# Patient Record
Sex: Female | Born: 1970 | Race: White | Hispanic: No | Marital: Married | Smoking: Never smoker
Health system: Southern US, Community
[De-identification: ages and names within clinical notes are randomized; demographics above are authoritative.]

## PROBLEM LIST (undated history)

## (undated) DIAGNOSIS — K219 Gastro-esophageal reflux disease without esophagitis: Secondary | ICD-10-CM

## (undated) HISTORY — PX: DILATION AND CURETTAGE OF UTERUS: SHX78

---

## 2018-10-19 ENCOUNTER — Other Ambulatory Visit: Payer: Self-pay | Admitting: Otolaryngology

## 2018-10-19 DIAGNOSIS — L723 Sebaceous cyst: Secondary | ICD-10-CM

## 2018-10-26 ENCOUNTER — Ambulatory Visit
Admission: RE | Admit: 2018-10-26 | Discharge: 2018-10-26 | Disposition: A | Discharge status: Home / Self Care | Payer: 59 | Source: Ambulatory Visit | Attending: Otolaryngology | Admitting: Otolaryngology

## 2018-10-26 ENCOUNTER — Other Ambulatory Visit: Payer: Self-pay

## 2018-10-26 ENCOUNTER — Encounter (INDEPENDENT_AMBULATORY_CARE_PROVIDER_SITE_OTHER): Payer: Self-pay

## 2018-10-26 DIAGNOSIS — L723 Sebaceous cyst: Secondary | ICD-10-CM | POA: Diagnosis not present

## 2018-10-26 MED ORDER — IOHEXOL 300 MG/ML  SOLN
75.0000 mL | Freq: Once | INTRAMUSCULAR | Status: AC | PRN
  Administered 2018-10-26: 10:00:00 75 mL via INTRAVENOUS

## 2018-12-04 ENCOUNTER — Other Ambulatory Visit: Payer: Self-pay

## 2018-12-04 ENCOUNTER — Other Ambulatory Visit
Admission: RE | Admit: 2018-12-04 | Discharge: 2018-12-04 | Disposition: A | Discharge status: Home / Self Care | Payer: 59 | Source: Ambulatory Visit | Attending: Otolaryngology | Admitting: Otolaryngology

## 2018-12-04 DIAGNOSIS — I82891 Chronic embolism and thrombosis of other specified veins: Secondary | ICD-10-CM | POA: Diagnosis not present

## 2018-12-04 DIAGNOSIS — Z20828 Contact with and (suspected) exposure to other viral communicable diseases: Secondary | ICD-10-CM | POA: Insufficient documentation

## 2018-12-04 DIAGNOSIS — L989 Disorder of the skin and subcutaneous tissue, unspecified: Secondary | ICD-10-CM | POA: Diagnosis present

## 2018-12-04 DIAGNOSIS — Z01812 Encounter for preprocedural laboratory examination: Secondary | ICD-10-CM | POA: Insufficient documentation

## 2018-12-04 DIAGNOSIS — Z79899 Other long term (current) drug therapy: Secondary | ICD-10-CM | POA: Diagnosis not present

## 2018-12-04 DIAGNOSIS — K219 Gastro-esophageal reflux disease without esophagitis: Secondary | ICD-10-CM | POA: Diagnosis not present

## 2018-12-04 LAB — SARS CORONAVIRUS 2 (TAT 6-24 HRS): SARS Coronavirus 2: NEGATIVE

## 2018-12-07 ENCOUNTER — Encounter: Payer: Self-pay | Admitting: Otolaryngology

## 2018-12-07 ENCOUNTER — Ambulatory Visit: Payer: 59 | Admitting: Anesthesiology

## 2018-12-07 ENCOUNTER — Ambulatory Visit
Admission: RE | Admit: 2018-12-07 | Discharge: 2018-12-07 | Disposition: A | Discharge status: Home / Self Care | Payer: 59 | Attending: Otolaryngology | Admitting: Otolaryngology

## 2018-12-07 ENCOUNTER — Encounter
Admission: RE | Disposition: A | Discharge status: Home / Self Care | Payer: Self-pay | Source: Home / Self Care | Attending: Otolaryngology

## 2018-12-07 DIAGNOSIS — Z79899 Other long term (current) drug therapy: Secondary | ICD-10-CM | POA: Diagnosis not present

## 2018-12-07 DIAGNOSIS — K219 Gastro-esophageal reflux disease without esophagitis: Secondary | ICD-10-CM | POA: Insufficient documentation

## 2018-12-07 DIAGNOSIS — I82891 Chronic embolism and thrombosis of other specified veins: Secondary | ICD-10-CM | POA: Insufficient documentation

## 2018-12-07 DIAGNOSIS — L989 Disorder of the skin and subcutaneous tissue, unspecified: Secondary | ICD-10-CM | POA: Diagnosis present

## 2018-12-07 HISTORY — DX: Gastro-esophageal reflux disease without esophagitis: K21.9

## 2018-12-07 HISTORY — PX: MASS EXCISION: SHX2000

## 2018-12-07 LAB — POCT PREGNANCY, URINE: Preg Test, Ur: NEGATIVE

## 2018-12-07 SURGERY — EXCISION MASS
Anesthesia: General | Site: Face | Laterality: Right

## 2018-12-07 MED ORDER — SCOPOLAMINE 1 MG/3DAYS TD PT72
1.0000 | MEDICATED_PATCH | Freq: Once | TRANSDERMAL | Status: DC
  Administered 2018-12-07: 10:00:00 1.5 mg via TRANSDERMAL

## 2018-12-07 MED ORDER — FENTANYL CITRATE (PF) 100 MCG/2ML IJ SOLN
INTRAMUSCULAR | Status: DC | PRN
  Administered 2018-12-07 (×2): 50 ug via INTRAVENOUS

## 2018-12-07 MED ORDER — ACETAMINOPHEN 160 MG/5ML PO SOLN: 325.0000 mg | ORAL | Status: DC | PRN

## 2018-12-07 MED ORDER — DEXMEDETOMIDINE HCL 200 MCG/2ML IV SOLN
INTRAVENOUS | Status: DC | PRN
  Administered 2018-12-07: 5 ug via INTRAVENOUS

## 2018-12-07 MED ORDER — MIDAZOLAM HCL 5 MG/5ML IJ SOLN
INTRAMUSCULAR | Status: DC | PRN
  Administered 2018-12-07: 2 mg via INTRAVENOUS

## 2018-12-07 MED ORDER — BACITRACIN-NEOMYCIN-POLYMYXIN OINTMENT TUBE
TOPICAL_OINTMENT | CUTANEOUS | Status: DC | PRN
  Administered 2018-12-07: 1 via TOPICAL

## 2018-12-07 MED ORDER — ACETAMINOPHEN 325 MG PO TABS: 325.0000 mg | ORAL_TABLET | ORAL | Status: DC | PRN

## 2018-12-07 MED ORDER — HYDROCODONE-ACETAMINOPHEN 5-325 MG PO TABS: 1.0000 | ORAL_TABLET | Freq: Four times a day (QID) | ORAL | 0 refills | Status: AC | PRN

## 2018-12-07 MED ORDER — ONDANSETRON HCL 4 MG/2ML IJ SOLN
INTRAMUSCULAR | Status: DC | PRN
  Administered 2018-12-07: 4 mg via INTRAVENOUS

## 2018-12-07 MED ORDER — LACTATED RINGERS IV SOLN
INTRAVENOUS | Status: DC
  Administered 2018-12-07: 10:00:00 via INTRAVENOUS

## 2018-12-07 MED ORDER — LIDOCAINE HCL (CARDIAC) PF 100 MG/5ML IV SOSY
PREFILLED_SYRINGE | INTRAVENOUS | Status: DC | PRN
  Administered 2018-12-07: 30 mg via INTRATRACHEAL

## 2018-12-07 MED ORDER — OXYCODONE HCL 5 MG PO TABS: 5.0000 mg | ORAL_TABLET | Freq: Once | ORAL | Status: DC | PRN

## 2018-12-07 MED ORDER — PROPOFOL 10 MG/ML IV BOLUS
INTRAVENOUS | Status: DC | PRN
  Administered 2018-12-07: 150 mg via INTRAVENOUS

## 2018-12-07 MED ORDER — ACETAMINOPHEN 10 MG/ML IV SOLN
INTRAVENOUS | Status: DC | PRN
  Administered 2018-12-07: 1000 mg via INTRAVENOUS

## 2018-12-07 MED ORDER — LIDOCAINE-EPINEPHRINE 1 %-1:100000 IJ SOLN
INTRAMUSCULAR | Status: DC | PRN
  Administered 2018-12-07: 3 mL

## 2018-12-07 MED ORDER — OXYCODONE HCL 5 MG/5ML PO SOLN: 5.0000 mg | Freq: Once | ORAL | Status: DC | PRN

## 2018-12-07 MED ORDER — FENTANYL CITRATE (PF) 100 MCG/2ML IJ SOLN: 25.0000 ug | INTRAMUSCULAR | Status: DC | PRN

## 2018-12-07 MED ORDER — DEXAMETHASONE SODIUM PHOSPHATE 4 MG/ML IJ SOLN
INTRAMUSCULAR | Status: DC | PRN
  Administered 2018-12-07: 4 mg via INTRAVENOUS

## 2018-12-07 MED ORDER — EPHEDRINE SULFATE 50 MG/ML IJ SOLN
INTRAMUSCULAR | Status: DC | PRN
  Administered 2018-12-07: 10 mg via INTRAVENOUS
  Administered 2018-12-07: 5 mg via INTRAVENOUS
  Administered 2018-12-07: 10 mg via INTRAVENOUS
  Administered 2018-12-07: 5 mg via INTRAVENOUS

## 2018-12-07 MED ORDER — GLYCOPYRROLATE 0.2 MG/ML IJ SOLN
INTRAMUSCULAR | Status: DC | PRN
  Administered 2018-12-07: 0.1 mg via INTRAVENOUS

## 2018-12-07 MED ORDER — ONDANSETRON HCL 4 MG/2ML IJ SOLN: 4.0000 mg | Freq: Once | INTRAMUSCULAR | Status: DC | PRN

## 2018-12-07 SURGICAL SUPPLY — 24 items
APPLICATOR COTTON TIP 6 STRL (MISCELLANEOUS) ×4 IMPLANT
APPLICATOR COTTON TIP 6IN STRL (MISCELLANEOUS) ×12
CORD BIP STRL DISP 12FT (MISCELLANEOUS) ×3 IMPLANT
DRSG TEGADERM 2-3/8X2-3/4 SM (GAUZE/BANDAGES/DRESSINGS) ×3 IMPLANT
DRSG TELFA 4X3 1S NADH ST (GAUZE/BANDAGES/DRESSINGS) ×3 IMPLANT
ELECT REM PT RETURN 9FT ADLT (ELECTROSURGICAL) ×3
ELECTRODE REM PT RTRN 9FT ADLT (ELECTROSURGICAL) ×1 IMPLANT
GAUZE 4X4 16PLY RFD (DISPOSABLE) ×3 IMPLANT
GLOVE PI ULTRA LF STRL 7.5 (GLOVE) ×2 IMPLANT
GLOVE PI ULTRA NON LATEX 7.5 (GLOVE) ×4
GOWN STRL REUS W/ TWL LRG LVL3 (GOWN DISPOSABLE) ×1 IMPLANT
GOWN STRL REUS W/TWL LRG LVL3 (GOWN DISPOSABLE) ×2
KIT TURNOVER KIT A (KITS) ×3 IMPLANT
NEEDLE HYPO 25GX1X1/2 BEV (NEEDLE) ×3 IMPLANT
NS IRRIG 500ML POUR BTL (IV SOLUTION) ×3 IMPLANT
PACK ENT CUSTOM (PACKS) ×3 IMPLANT
SOL PREP PVP 2OZ (MISCELLANEOUS) ×3
SOLUTION PREP PVP 2OZ (MISCELLANEOUS) ×1 IMPLANT
STRAP BODY AND KNEE 60X3 (MISCELLANEOUS) ×3 IMPLANT
SUCTION FRAZIER TIP 10 FR DISP (SUCTIONS) ×3 IMPLANT
SUT PROLENE 6 0 P 1 18 (SUTURE) ×3 IMPLANT
SUT VIC AB 5-0 P-3 18X BRD (SUTURE) ×1 IMPLANT
SUT VIC AB 5-0 P3 18 (SUTURE) ×2
SYR 10ML LL (SYRINGE) ×3 IMPLANT

## 2018-12-07 NOTE — Discharge Instructions (Signed)
Scopolamine skin patches REMOVE PATCH IN 72 HOURS AND WASH HANDS IMMEDIATELY What is this medicine? SCOPOLAMINE (skoe POL a meen) is used to prevent nausea and vomiting caused by motion sickness, anesthesia and surgery. This medicine may be used for other purposes; ask your health care provider or pharmacist if you have questions. COMMON BRAND NAME(S): Transderm Scop What should I tell my health care provider before I take this medicine? They need to know if you have any of these conditions:  are scheduled to have a gastric secretion test  glaucoma  heart disease  kidney disease  liver disease  lung or breathing disease, like asthma  mental illness  prostate disease  seizures  stomach or intestine problems  trouble passing urine  an unusual or allergic reaction to scopolamine, atropine, other medicines, foods, dyes, or preservatives  pregnant or trying to get pregnant  breast-feeding How should I use this medicine? This medicine is for external use only. Follow the directions on the prescription label. Wear only 1 patch at a time. Choose an area behind the ear, that is clean, dry, hairless and free from any cuts or irritation. Wipe the area with a clean dry tissue. Peel off the plastic backing of the skin patch, trying not to touch the adhesive side with your hands. Do not cut the patches. Firmly apply to the area you have chosen, with the metallic side of the patch to the skin and the tan-colored side showing. Once firmly in place, wash your hands well with soap and water. Do not get this medicine into your eyes. After removing the patch, wash your hands and the area behind your ear thoroughly with soap and water. The patch will still contain some medicine after use. To avoid accidental contact or ingestion by children or pets, fold the used patch in half with the sticky side together and throw away in the trash out of the reach of children and pets. If you need to use a second  patch after you remove the first, place it behind the other ear. A special MedGuide will be given to you by the pharmacist with each prescription and refill. Be sure to read this information carefully each time. Talk to your pediatrician regarding the use of this medicine in children. Special care may be needed. Overdosage: If you think you have taken too much of this medicine contact a poison control center or emergency room at once. NOTE: This medicine is only for you. Do not share this medicine with others. What if I miss a dose? This does not apply. This medicine is not for regular use. What may interact with this medicine?  alcohol  antihistamines for allergy cough and cold  atropine  certain medicines for anxiety or sleep  certain medicines for bladder problems like oxybutynin, tolterodine  certain medicines for depression like amitriptyline, fluoxetine, sertraline  certain medicines for stomach problems like dicyclomine, hyoscyamine  certain medicines for Parkinson's disease like benztropine, trihexyphenidyl  certain medicines for seizures like phenobarbital, primidone  general anesthetics like halothane, isoflurane, methoxyflurane, propofol  ipratropium  local anesthetics like lidocaine, pramoxine, tetracaine  medicines that relax muscles for surgery  phenothiazines like chlorpromazine, mesoridazine, prochlorperazine, thioridazine  narcotic medicines for pain  other belladonna alkaloids This list may not describe all possible interactions. Give your health care provider a list of all the medicines, herbs, non-prescription drugs, or dietary supplements you use. Also tell them if you smoke, drink alcohol, or use illegal drugs. Some items may interact with  your medicine. What should I watch for while using this medicine? Limit contact with water while swimming and bathing because the patch may fall off. If the patch falls off, throw it away and put a new one behind the  other ear. You may get drowsy or dizzy. Do not drive, use machinery, or do anything that needs mental alertness until you know how this medicine affects you. Do not stand or sit up quickly, especially if you are an older patient. This reduces the risk of dizzy or fainting spells. Alcohol may interfere with the effect of this medicine. Avoid alcoholic drinks. Your mouth may get dry. Chewing sugarless gum or sucking hard candy, and drinking plenty of water may help. Contact your healthcare professional if the problem does not go away or is severe. This medicine may cause dry eyes and blurred vision. If you wear contact lenses, you may feel some discomfort. Lubricating drops may help. See your healthcare professional if the problem does not go away or is severe. If you are going to need surgery, an MRI, CT scan, or other procedure, tell your healthcare professional that you are using this medicine. You may need to remove the patch before the procedure. What side effects may I notice from receiving this medicine? Side effects that you should report to your doctor or health care professional as soon as possible:  allergic reactions like skin rash, itching or hives; swelling of the face, lips, or tongue  blurred vision  changes in vision  confusion  dizziness  eye pain  fast, irregular heartbeat  hallucinations, loss of contact with reality  nausea, vomiting  pain or trouble passing urine  restlessness  seizures  skin irritation  stomach pain Side effects that usually do not require medical attention (report to your doctor or health care professional if they continue or are bothersome):  drowsiness  dry mouth  headache  sore throat This list may not describe all possible side effects. Call your doctor for medical advice about side effects. You may report side effects to FDA at 1-800-FDA-1088. Where should I keep my medicine? Keep out of the reach of children. Store at room  temperature between 20 and 25 degrees C (68 and 77 degrees F). Keep this medicine in the foil package until ready to use. Throw away any unused medicine after the expiration date. NOTE: This sheet is a summary. It may not cover all possible information. If you have questions about this medicine, talk to your doctor, pharmacist, or health care provider.  2020 Elsevier/Gold Standard (2017-05-20 16:14:46)   General Anesthesia, Adult, Care After This sheet gives you information about how to care for yourself after your procedure. Your health care provider may also give you more specific instructions. If you have problems or questions, contact your health care provider. What can I expect after the procedure? After the procedure, the following side effects are common:  Pain or discomfort at the IV site.  Nausea.  Vomiting.  Sore throat.  Trouble concentrating.  Feeling cold or chills.  Weak or tired.  Sleepiness and fatigue.  Soreness and body aches. These side effects can affect parts of the body that were not involved in surgery. Follow these instructions at home:  For at least 24 hours after the procedure:  Have a responsible adult stay with you. It is important to have someone help care for you until you are awake and alert.  Rest as needed.  Do not: ? Participate in activities in which  you could fall or become injured. ? Drive. ? Use heavy machinery. ? Drink alcohol. ? Take sleeping pills or medicines that cause drowsiness. ? Make important decisions or sign legal documents. ? Take care of children on your own. Eating and drinking  Follow any instructions from your health care provider about eating or drinking restrictions.  When you feel hungry, start by eating small amounts of foods that are soft and easy to digest (bland), such as toast. Gradually return to your regular diet.  Drink enough fluid to keep your urine pale yellow.  If you vomit, rehydrate by drinking  water, juice, or clear broth. General instructions  If you have sleep apnea, surgery and certain medicines can increase your risk for breathing problems. Follow instructions from your health care provider about wearing your sleep device: ? Anytime you are sleeping, including during daytime naps. ? While taking prescription pain medicines, sleeping medicines, or medicines that make you drowsy.  Return to your normal activities as told by your health care provider. Ask your health care provider what activities are safe for you.  Take over-the-counter and prescription medicines only as told by your health care provider.  If you smoke, do not smoke without supervision.  Keep all follow-up visits as told by your health care provider. This is important. Contact a health care provider if:  You have nausea or vomiting that does not get better with medicine.  You cannot eat or drink without vomiting.  You have pain that does not get better with medicine.  You are unable to pass urine.  You develop a skin rash.  You have a fever.  You have redness around your IV site that gets worse. Get help right away if:  You have difficulty breathing.  You have chest pain.  You have blood in your urine or stool, or you vomit blood. Summary  After the procedure, it is common to have a sore throat or nausea. It is also common to feel tired.  Have a responsible adult stay with you for the first 24 hours after general anesthesia. It is important to have someone help care for you until you are awake and alert.  When you feel hungry, start by eating small amounts of foods that are soft and easy to digest (bland), such as toast. Gradually return to your regular diet.  Drink enough fluid to keep your urine pale yellow.  Return to your normal activities as told by your health care provider. Ask your health care provider what activities are safe for you. This information is not intended to replace  advice given to you by your health care provider. Make sure you discuss any questions you have with your health care provider. Document Released: 06/07/2000 Document Revised: 03/04/2017 Document Reviewed: 10/15/2016 Elsevier Patient Education  2020 [employer].

## 2018-12-07 NOTE — Op Note (Signed)
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  It was purple and appeared to be dilated veins that were heaped together and a roundish mass approximately 2 cm x 1-1/2 cm x 1-1/2 cm.  Procedure: The patient was brought to the operating room and placed in supine position.  She was given general anesthesia by oral endotracheal intubation.  Her head was turned slightly to the left and the right side of her face was prepped.  Continuous facial nerve monitoring was done of the periorbital muscle.  A second facial nerve probe was placed to be used as needed.  A skin incision was marked beneath the infraorbital rim and a horizontal slightly curved incision line following skin creases and overlying the mass.  Approximately 3 mL of 1% Xylocaine with epi 1-100,000 were used for infiltration into the soft tissues around the mass.  When she was prepped and draped the incision in the right cheek skin was performed.  This was carried through the subcu and bipolar cautery was used to control small amounts of bleeding in the subcu tissue.  The fat was loosely dissected through until the periorbital muscle was found.  Dissection was carried along this to free the fat off of it and immediately could see a purpleish mass that was sticking up and was quite soft and compressible.  Dissection was carefully made all the way around this to loosen it up from the overlying fat.  It was scarred to the periorbital muscle on its inferior border of the muscle.  Bipolar cautery was used to free up the fascial bands that were tethered between the tumor and the muscle and fat.  More medially appeared to  be a vein that was running into this and bipolar cautery was used to cut across this vein and control bleeding.  The medial structures were freed up from fat.  The deeper edge of the tumor was freed from some fat was overlying the periosteum of the infraorbital bone.  The infraorbital nerve was not encountered as there was still little fat around that and the tumor did not extend down to periosteum.  The deep inferior edge of the tumor seem to have another low venous attachment that was cut across with bipolar cautery.  This freed up the remaining attachment of the tumor and it was sent for permanent section.  The wound was irrigated copiously.  I could not see any further signs of tumor nor could I feel any abnormal masses in the wound.  Some deep stitches were used to help hold some fatty tissue together in the deep portion of the wound and then to pull together the fatty tissue in the subdermal plane.  The dermis was then closed with interrupted 6-0 Vicryls to pull the skin edges together and take the tension off the wound.  The skin edges were then held in apposition with a running 6-0 Prolene in a locking fashion.  The wound was then washed and covered with a small amount of Neosporin followed by Telfa and Tegaderm.  The patient tolerated the procedure well.  She was awakened taken to recovery  room in a satisfactory condition.  There were no operative complications.  Dispo: To PACU to be discharged home  Plan: She will rest at home with head elevated.  I have given her some Norco 5/325 to use for pain if necessary.  She does not need an antibiotic.  We will see her in 5 days for suture removal and wound assessment and then go over path report with her as well.  She will let me know if she has any problems that arise at home.  Kristen Banks  12/07/2018 11:47 AM

## 2018-12-07 NOTE — Transfer of Care (Signed)
Immediate Anesthesia Transfer of Care Note  Patient: Kristen Banks  Procedure(s) Performed: EXCISION RIGHT CHEEK TUMOR (Right Face)  Patient Location: PACU  Anesthesia Type: General LMA  Level of Consciousness: awake, alert  and patient cooperative  Airway and Oxygen Therapy: Patient Spontanous Breathing and Patient connected to supplemental oxygen  Post-op Assessment: Post-op Vital signs reviewed, Patient's Cardiovascular Status Stable, Respiratory Function Stable, Patent Airway and No signs of Nausea or vomiting  Post-op Vital Signs: Reviewed and stable  Complications: No apparent anesthesia complications

## 2018-12-07 NOTE — H&P (Signed)
H&P has been reviewed and patient reevaluated, no changes necessary. To be downloaded later.  

## 2018-12-07 NOTE — Anesthesia Procedure Notes (Signed)
Procedure Name: LMA Insertion Date/Time: 12/07/2018 10:32 AM Performed by: Georga Bora, CRNA Pre-anesthesia Checklist: Patient identified, Emergency Drugs available, Suction available, Timeout performed and Patient being monitored Patient Re-evaluated:Patient Re-evaluated prior to induction Oxygen Delivery Method: Circle system utilized Preoxygenation: Pre-oxygenation with 100% oxygen Induction Type: IV induction LMA: LMA inserted LMA Size: 4.0 Number of attempts: 1 Placement Confirmation: positive ETCO2 and breath sounds checked- equal and bilateral Tube secured with: Tape

## 2018-12-07 NOTE — Anesthesia Postprocedure Evaluation (Signed)
Anesthesia Post Note  Patient: Kristen Banks  Procedure(s) Performed: EXCISION RIGHT CHEEK TUMOR (Right Face)  Patient location during evaluation: PACU Anesthesia Type: General Level of consciousness: awake and alert and oriented Pain management: satisfactory to patient Vital Signs Assessment: post-procedure vital signs reviewed and stable Respiratory status: spontaneous breathing, nonlabored ventilation and respiratory function stable Cardiovascular status: blood pressure returned to baseline and stable Postop Assessment: Adequate PO intake and No signs of nausea or vomiting Anesthetic complications: no    Raliegh Ip

## 2018-12-07 NOTE — Anesthesia Preprocedure Evaluation (Signed)
Anesthesia Evaluation  Patient identified by MRN, date of birth, ID band Patient awake    Reviewed: Allergy & Precautions, H&P , NPO status , Patient's Chart, lab work & pertinent test results  Airway Mallampati: II  TM Distance: >3 FB Neck ROM: full    Dental no notable dental hx.    Pulmonary    Pulmonary exam normal breath sounds clear to auscultation       Cardiovascular Normal cardiovascular exam Rhythm:regular Rate:Normal     Neuro/Psych    GI/Hepatic GERD  ,  Endo/Other    Renal/GU      Musculoskeletal   Abdominal   Peds  Hematology   Anesthesia Other Findings   Reproductive/Obstetrics                             Anesthesia Physical Anesthesia Plan  ASA: II  Anesthesia Plan: General LMA   Post-op Pain Management:    Induction:   PONV Risk Score and Plan: 3 and Ondansetron, Dexamethasone, Scopolamine patch - Pre-op, Midazolam and Treatment may vary due to age or medical condition  Airway Management Planned:   Additional Equipment:   Intra-op Plan:   Post-operative Plan:   Informed Consent: I have reviewed the patients History and Physical, chart, labs and discussed the procedure including the risks, benefits and alternatives for the proposed anesthesia with the patient or authorized representative who has indicated his/her understanding and acceptance.       Plan Discussed with: CRNA  Anesthesia Plan Comments:         Anesthesia Quick Evaluation

## 2018-12-12 LAB — SURGICAL PATHOLOGY

## 2020-08-24 ENCOUNTER — Ambulatory Visit
Admission: EM | Admit: 2020-08-24 | Discharge: 2020-08-24 | Disposition: A | Discharge status: Home / Self Care | Payer: 59

## 2020-08-24 ENCOUNTER — Encounter: Payer: Self-pay | Admitting: Emergency Medicine

## 2020-08-24 ENCOUNTER — Other Ambulatory Visit: Payer: Self-pay

## 2020-08-24 DIAGNOSIS — T63481A Toxic effect of venom of other arthropod, accidental (unintentional), initial encounter: Secondary | ICD-10-CM

## 2020-08-24 MED ORDER — PREDNISONE 20 MG PO TABS: ORAL_TABLET | ORAL | 0 refills | Status: AC

## 2020-08-24 NOTE — ED Provider Notes (Signed)
[facility]    CSN: [id number] Arrival date & time: 08/24/20  1426      History   Chief Complaint Chief Complaint  Patient presents with   Insect Bite    Bee sting    HPI KESTREL MIS is a 50 y.o. female.   Kristen Banks presents with complaints of left hand and wrist pain and swelling s/p wasp sting/ bite yesterday. The insect was visualized at time of sting. Known bee allergy. Took claritin immediately afterwards which did seem to help some. She also used her Epi-Pen in hopes that this would help her swelling. Woke this morning around 0130 with itching to chest and anterior neck, took additional claritin and that helped. Left hand still swollen with pain radiating up arm to shoulder. Denies any wheezing, oral swelling, shortness of breath, tongue swellign or difficulty swallowing.  ROS per HPI, negative if not otherwise mentioned.     Past Medical History:  Diagnosis Date   GERD (gastroesophageal reflux disease)     There are no problems to display for this patient.   Past Surgical History:  Procedure Laterality Date   DILATION AND CURETTAGE OF UTERUS     x 3   MASS EXCISION Right 12/07/2018   Procedure: EXCISION RIGHT CHEEK TUMOR;  Surgeon: Vernie Murders, MD;  Location: [facility];  Service: ENT;  Laterality: Right;  NEED PLASTIC TRAY AND FACIAL NERVE MONITOR    OB History   No obstetric history on file.      Home Medications    Prior to Admission medications   Medication Sig Start Date End Date Taking? Authorizing Provider  Ascorbic Acid (VITAMIN C) 1000 MG tablet Take 1,000 mg by mouth daily.   Yes [provider]  b complex vitamins tablet Take 1 tablet by mouth daily.   Yes [provider]  Calcium Carbonate-Vit D-Min (CALCIUM 1200 PO) Take by mouth.   Yes [provider]  loratadine (CLARITIN) 10 MG tablet Take by mouth.   Yes [provider]  Multiple Vitamin (MULTIVITAMIN) tablet Take  1 tablet by mouth daily.   Yes [provider]  omeprazole (PRILOSEC) 20 MG capsule Take 20 mg by mouth daily.   Yes [provider]  predniSONE (DELTASONE) 20 MG tablet 3-3-3-2-2-2-1-1-1 08/24/20  Yes Emmett Arntz, Barron Alvine, NP  Probiotic Product (PROBIOTIC-10) CHEW Chew by mouth.   Yes [provider]  acetaminophen (TYLENOL) 500 MG tablet Take 500 mg by mouth every 6 (six) hours as needed.    [provider]  minocycline (DYNACIN) 100 MG tablet Take 200 mg by mouth daily.    [provider]    Family History History reviewed. No pertinent family history.  Social History Social History   Tobacco Use   Smoking status: Never   Smokeless tobacco: Never  Vaping Use   Vaping Use: Never used  Substance Use Topics   Alcohol use: Yes    Comment: occasionally     Allergies   Bee pollen   Review of Systems Review of Systems   Physical Exam Triage Vital Signs ED Triage Vitals  Enc Vitals Group     BP 08/24/20 1441 128/78     Pulse Rate 08/24/20 1441 67     Resp 08/24/20 1441 14     Temp 08/24/20 1441 98.4 F (36.9 C)     Temp Source 08/24/20 1441 Oral     SpO2 08/24/20 1441 97 %     Weight 08/24/20  1438 194 lb (88 kg)     Height 08/24/20 1438 5' 5.5" (1.664 m)     Head Circumference --      Peak Flow --      Pain Score 08/24/20 1438 4     Pain Loc --      Pain Edu? --      Excl. in GC? --    No data found.  Updated Vital Signs BP 128/78 (BP Location: Left Arm)   Pulse 67   Temp 98.4 F (36.9 C) (Oral)   Resp 14   Ht 5' 5.5" (1.664 m)   Wt 194 lb (88 kg)   LMP 06/24/2020 (Approximate)   SpO2 97%   BMI 31.79 kg/m   Visual Acuity Right Eye Distance:   Left Eye Distance:   Bilateral Distance:    Right Eye Near:   Left Eye Near:    Bilateral Near:     Physical Exam Constitutional:      General: She is not in acute distress.    Appearance: She is well-developed.  HENT:     Mouth/Throat:     Mouth: Mucous  membranes are moist.     Pharynx: Oropharynx is clear.  Cardiovascular:     Rate and Rhythm: Normal rate.  Pulmonary:     Effort: Pulmonary effort is normal.     Breath sounds: No stridor. No wheezing.  Musculoskeletal:     Comments: Left hand and wrist swelling and redness, extending around bite from lateral aspect of wrist   Skin:    General: Skin is warm and dry.  Neurological:     Mental Status: She is alert and oriented to person, place, and time.     UC Treatments / Results  Labs (all labs ordered are listed, but only abnormal results are displayed) Labs Reviewed - No data to display  EKG   Radiology No results found.  Procedures Procedures (including critical care time)  Medications Ordered in UC Medications - No data to display  Initial Impression / Assessment and Plan / UC Course  I have reviewed the triage vital signs and the nursing notes.  Pertinent labs & imaging results that were available during my care of the patient were reviewed by me and considered in my medical decision making (see chart for details).     Insect bite/sting with allergic response. Prednisone provided. Encouraged to continue with antihistamine. Return precautions provided. Patient verbalized understanding and agreeable to plan.    Final Clinical Impressions(s) / UC Diagnoses   Final diagnoses:  Allergic reaction to insect sting, accidental or unintentional, initial encounter     Discharge Instructions      Ice, elevate, prednisone, antihistamine to help with symptoms.  If symptoms worsen or do not improve in the next week to return to be seen or to follow up with your PCP.     ED Prescriptions     Medication Sig Dispense Auth. Provider   predniSONE (DELTASONE) 20 MG tablet 3-3-3-2-2-2-1-1-1 18 tablet Linus Mako B, NP      PDMP not reviewed this encounter.   Georgetta Haber, NP 08/24/20 1530

## 2020-08-24 NOTE — Discharge Instructions (Addendum)
Ice, elevate, prednisone, antihistamine to help with symptoms.  If symptoms worsen or do not improve in the next week to return to be seen or to follow up with your PCP.

## 2020-08-24 NOTE — ED Triage Notes (Signed)
Patient states that she was stung by a bee yesterday and did use her EpiPen yesterday.  Patient has ongoing swelling and pain in her left hand.  Patient states that the bee stung her in her left wrist.  Patient states that she can not take Benadryl.  Patient last took Claritin at 3am this morning.

## 2021-01-01 IMAGING — CT CT MAXILLOFACIAL WITH CONTRAST
1 series · 15 of 30 positions shown, 19 images · IV contrast (omnipaque)
Comparison: None.

CLINICAL DATA: Mass under the right eye noted in Monday May, 2018.
Tenderness.

EXAM:
CT MAXILLOFACIAL WITH CONTRAST
TECHNIQUE: Multidetector CT imaging of the maxillofacial structures was
performed with intravenous contrast. Multiplanar CT image
reconstructions were also generated.
CONTRAST:  75mL OMNIPAQUE IOHEXOL 300 MG/ML  SOLN

[Series 2: max soft · axial · 0.38mm/px · z∈[-416,-228]mm · 15 of 102 slices shown, 19 images]
[im 4/102  brain]
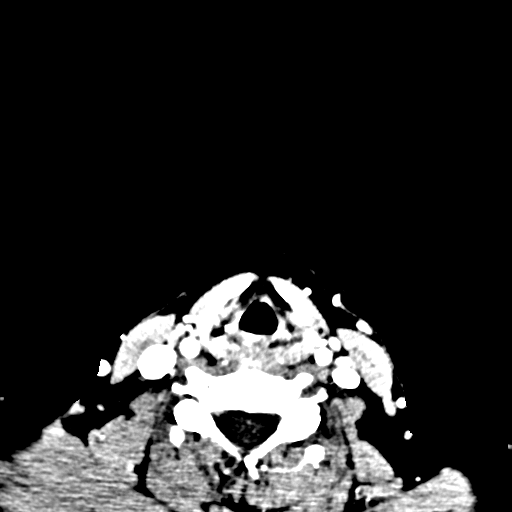
[im 4/102  bone]
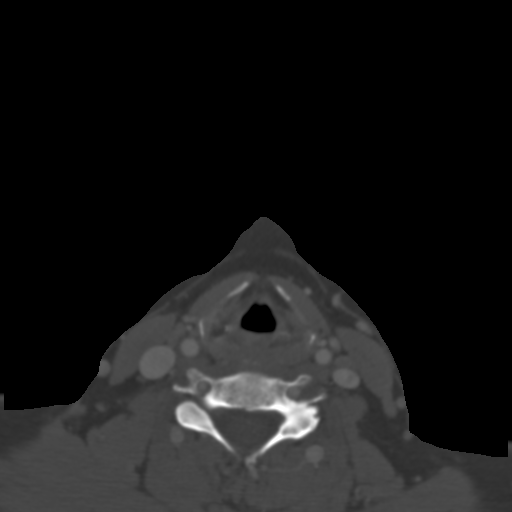
[im 11/102  bone]
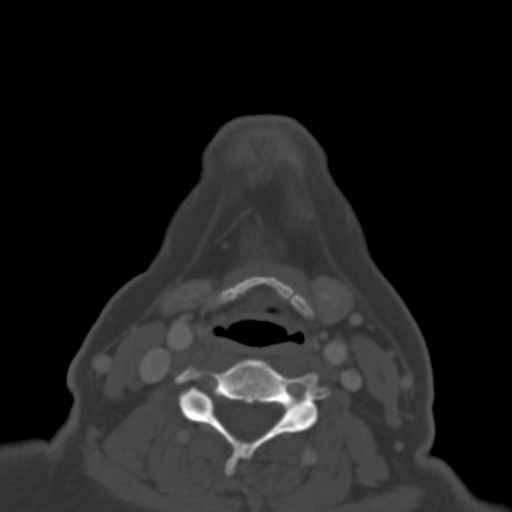
[im 18/102  bone]
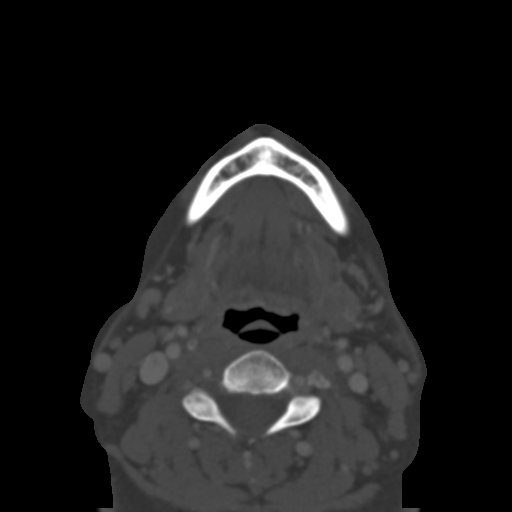
[im 25/102  bone]
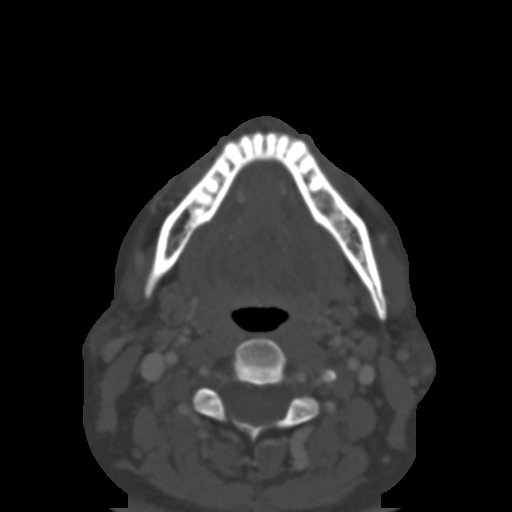
[im 32/102  brain]
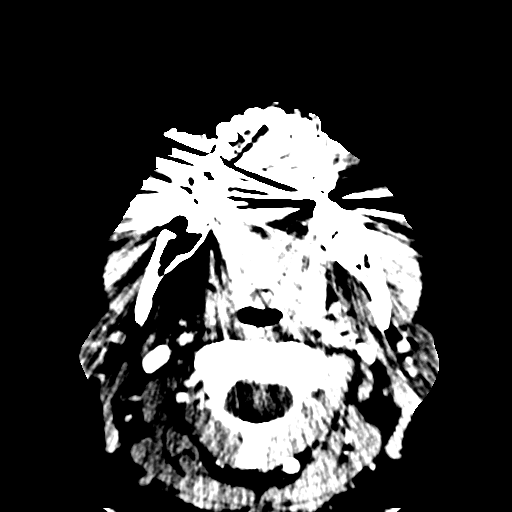
[im 32/102  bone]
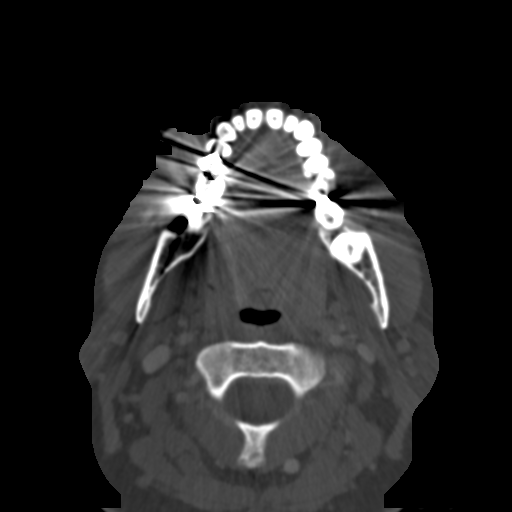
[im 39/102  bone]
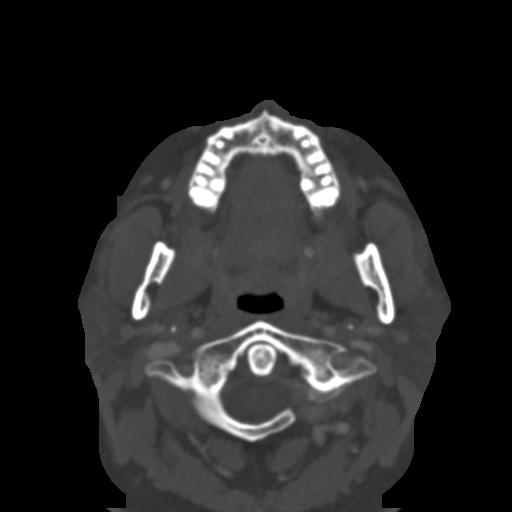
[im 46/102  bone]
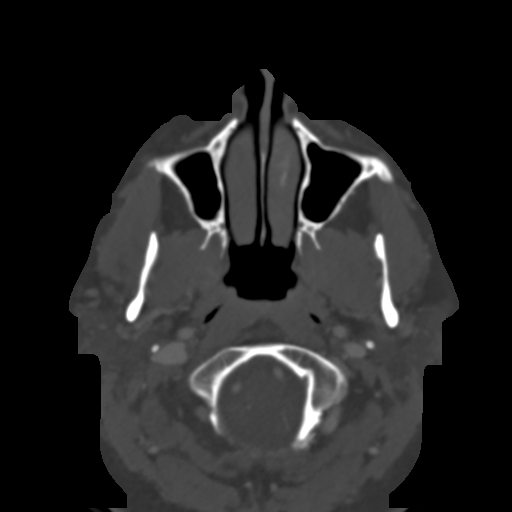
[im 53/102  bone]
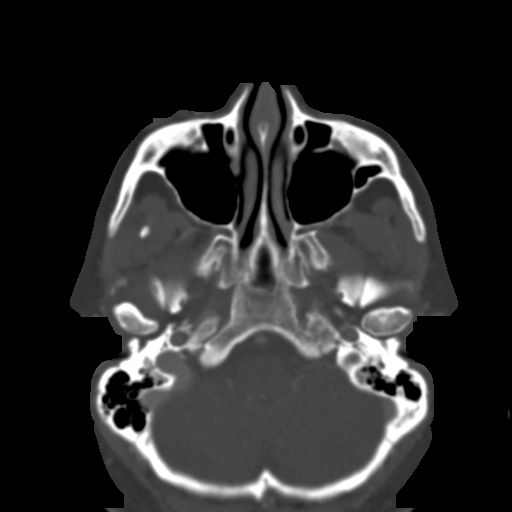
[im 56/102  brain]
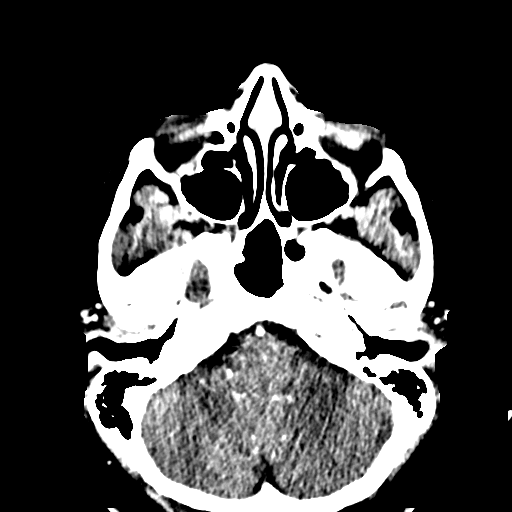
[im 56/102  bone]
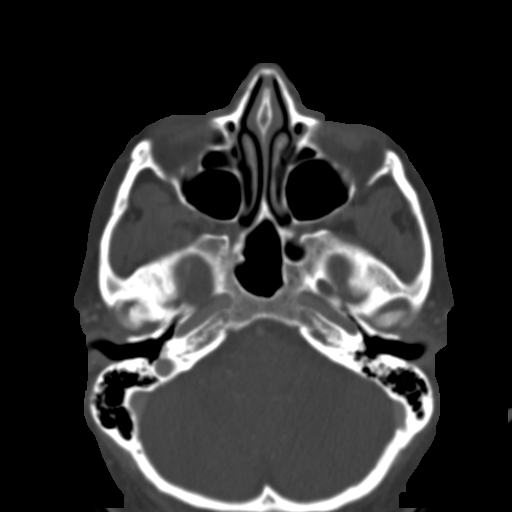
[im 63/102  bone]
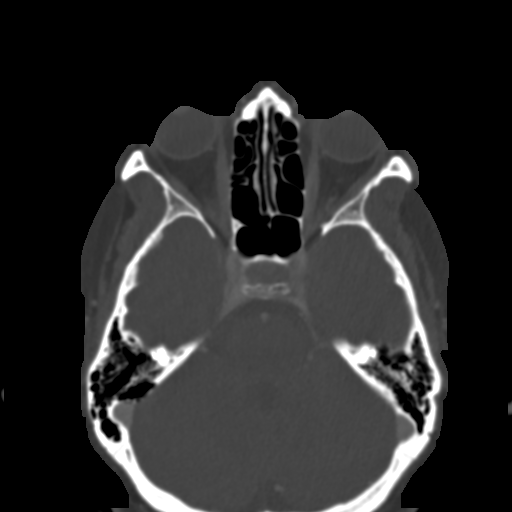
[im 70/102  bone]
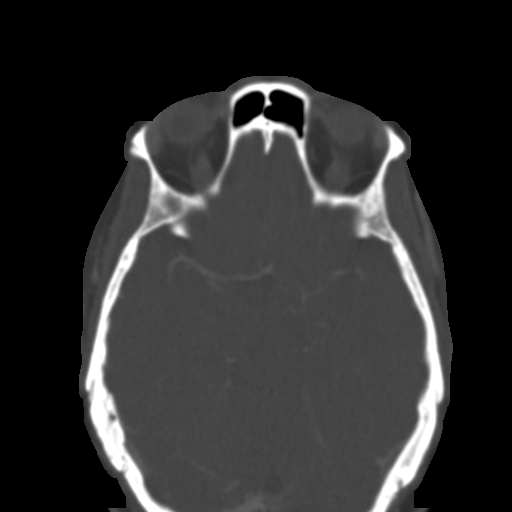
[im 77/102  bone]
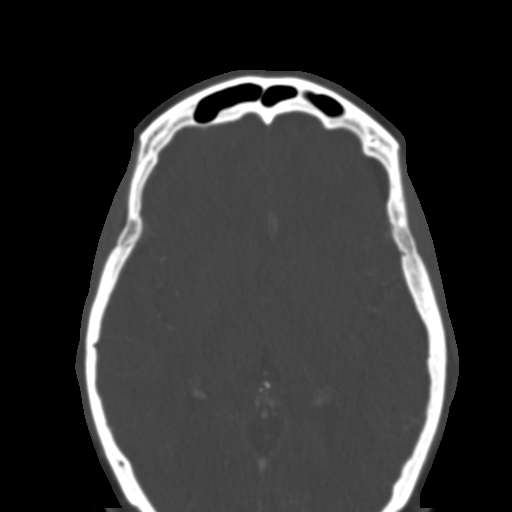
[im 84/102  brain]
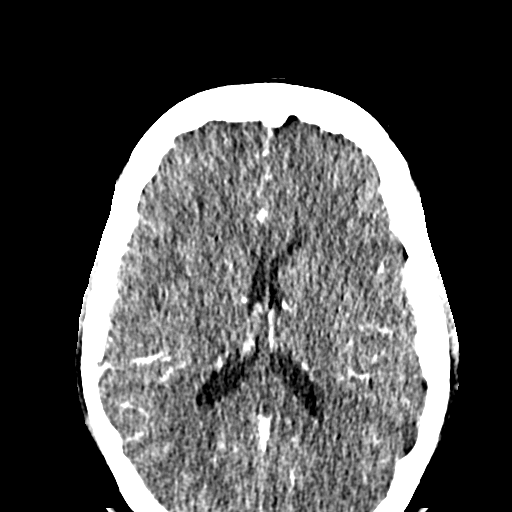
[im 84/102  bone]
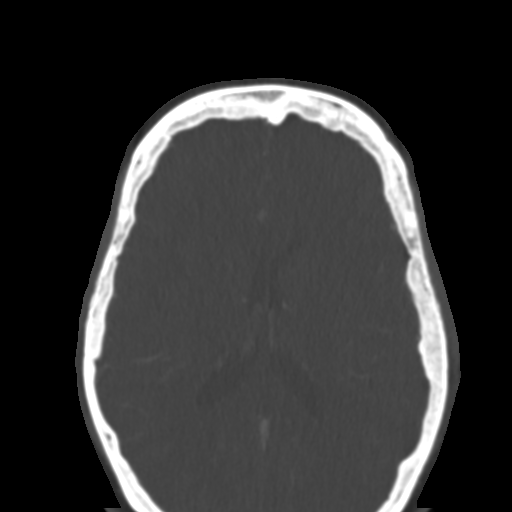
[im 91/102  bone]
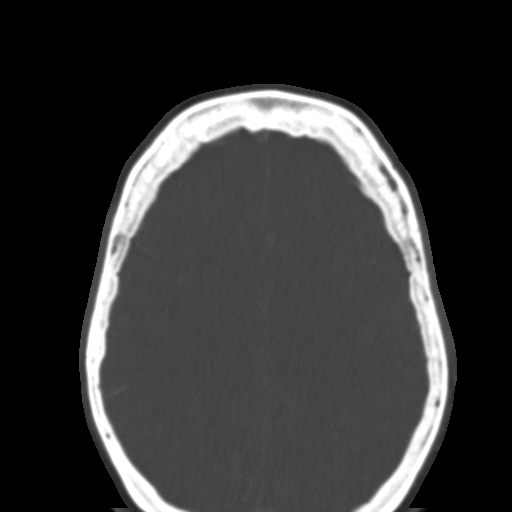
[im 98/102  bone]
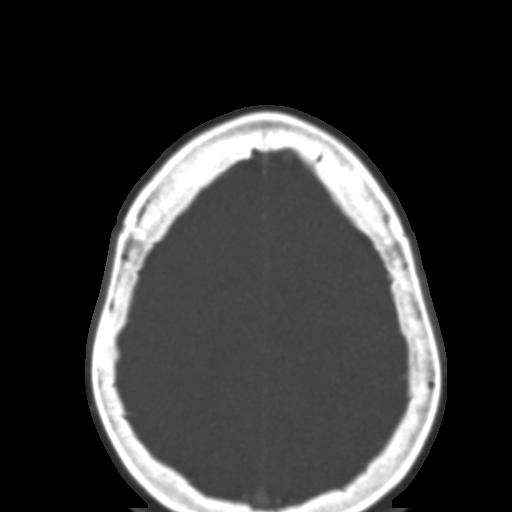

[15 of 30 positions shown; findings below may reference images not displayed]

FINDINGS: Osseous: Normal

Orbits: Normal

Sinuses: Clear

Soft tissues: 8 x 8 x 16 mm moderately well-circumscribed mass
within the subcutaneous fat of the right cheek inferior to the orbit
without bone involvement. Density measurement is higher than muscle,
suggesting that this could be a vascular abnormality/hemangioma. No
other regional soft tissue finding. Parotid and submandibular glands
are normal.

Limited intracranial: Normal
IMPRESSION: 8 x 8 x 16 mm moderately well-circumscribed mass in the subcutaneous
fat of the right anterior cheek inferior to the orbit. Density
measurements are greater than muscle, suggesting that this could be
a vascular abnormality/hemangioma. No other regional abnormal
finding.
